# Patient Record
Sex: Female | Born: 1945 | Race: Black or African American | Hispanic: No
Health system: Southern US, Community
[De-identification: ages and names within clinical notes are randomized; demographics above are authoritative.]

---

## 2017-11-21 ENCOUNTER — Emergency Department (HOSPITAL_COMMUNITY): Payer: Self-pay

## 2017-11-21 ENCOUNTER — Encounter (HOSPITAL_COMMUNITY): Payer: Self-pay

## 2017-11-21 ENCOUNTER — Other Ambulatory Visit: Payer: Self-pay

## 2017-11-21 ENCOUNTER — Emergency Department (HOSPITAL_COMMUNITY)
Admission: EM | Admit: 2017-11-21 | Discharge: 2017-11-21 | Disposition: A | Discharge status: Home / Self Care | Payer: Self-pay | Attending: Emergency Medicine | Admitting: Emergency Medicine

## 2017-11-21 DIAGNOSIS — E042 Nontoxic multinodular goiter: Secondary | ICD-10-CM | POA: Insufficient documentation

## 2017-11-21 DIAGNOSIS — R0789 Other chest pain: Secondary | ICD-10-CM | POA: Insufficient documentation

## 2017-11-21 DIAGNOSIS — E010 Iodine-deficiency related diffuse (endemic) goiter: Secondary | ICD-10-CM | POA: Insufficient documentation

## 2017-11-21 LAB — COMPREHENSIVE METABOLIC PANEL
ALT: 23 U/L (ref 14–54)
AST: 29 U/L (ref 15–41)
Albumin: 4.3 g/dL (ref 3.5–5.0)
Alkaline Phosphatase: 65 U/L (ref 38–126)
Anion gap: 11 (ref 5–15)
BUN: 13 mg/dL (ref 6–20)
CO2: 28 mmol/L (ref 22–32)
Calcium: 9.6 mg/dL (ref 8.9–10.3)
Chloride: 90 mmol/L — ABNORMAL LOW (ref 101–111)
Creatinine, Ser: 0.63 mg/dL (ref 0.44–1.00)
GFR calc Af Amer: >60 mL/min (ref >60)
GFR calc non Af Amer: >60 mL/min (ref >60)
Glucose, Bld: 132 mg/dL — ABNORMAL HIGH (ref 65–99)
Potassium: 3.5 mmol/L (ref 3.5–5.1)
Sodium: 129 mmol/L — ABNORMAL LOW (ref 135–145)
Total Bilirubin: 0.6 mg/dL (ref 0.3–1.2)
Total Protein: 9.5 g/dL — ABNORMAL HIGH (ref 6.5–8.1)

## 2017-11-21 LAB — CBC
HCT: 40.6 % (ref 36.0–46.0)
Hemoglobin: 14.8 g/dL (ref 12.0–15.0)
MCH: 30.1 pg (ref 26.0–34.0)
MCHC: 36.5 g/dL — ABNORMAL HIGH (ref 30.0–36.0)
MCV: 82.5 fL (ref 78.0–100.0)
Platelets: 255 10*3/uL (ref 150–400)
RBC: 4.92 MIL/uL (ref 3.87–5.11)
RDW: 13.3 % (ref 11.5–15.5)
WBC: 5.1 10*3/uL (ref 4.0–10.5)

## 2017-11-21 LAB — TSH: TSH: 1.42 u[IU]/mL (ref 0.350–4.500)

## 2017-11-21 LAB — I-STAT TROPONIN, ED: Troponin i, poc: 0.02 ng/mL (ref 0.00–0.08)

## 2017-11-21 MED ORDER — ONDANSETRON 4 MG PO TBDP
4.0000 mg | ORAL_TABLET | Freq: Once | ORAL | Status: AC
  Administered 2017-11-21: 4 mg via ORAL
  Filled 2017-11-21: qty 1

## 2017-11-21 MED ORDER — ONDANSETRON HCL 4 MG PO TABS: 4.0000 mg | ORAL_TABLET | Freq: Three times a day (TID) | ORAL | 0 refills | Status: AC | PRN

## 2017-11-21 MED ORDER — IOHEXOL 300 MG/ML  SOLN
75.0000 mL | Freq: Once | INTRAMUSCULAR | Status: AC | PRN
  Administered 2017-11-21: 75 mL via INTRAVENOUS

## 2017-11-21 MED ORDER — SODIUM CHLORIDE 0.9 % IV BOLUS
1000.0000 mL | Freq: Once | INTRAVENOUS | Status: AC
  Administered 2017-11-21: 1000 mL via INTRAVENOUS

## 2017-11-21 NOTE — ED Triage Notes (Signed)
Pt reports a lump to the L side of her neck x1 year. She is from [place] and arrived here about 1 month ago. Her family member became concerned about it and got her to come in. She denies pain. She states that when she lays down, it makes it more difficult to breath and she sometimes has night sweats. She denies cough. A&Ox4.

## 2017-11-21 NOTE — ED Provider Notes (Signed)
[facility]-EMERGENCY DEPT Provider Note   CSN: [id number] Arrival date & time: 11/21/17  1638     History   Chief Complaint Chief Complaint  Patient presents with  . Neck Pain    HPI Robin Moyer is a 72 y.o. female with history of hypertension presents for evaluation of acute onset, progressively worsening swelling to the left side of the neck.  Patient's son served as Nurse, learning disability through the duration of the encounter.  Patient noted swelling to the left side of the neck over the past 1 year which has been progressively enlarging.  She states that sometimes she feels as though the mass will move to the anterior aspect of the neck and will cause her to feel difficulty breathing, swallowing, and sometimes a choking sensation.  No drooling or facial swelling.  The mass is not particularly painful. She does note over the past few days she feels that  The mass has worsened also notes subjective fevers and chills and night sweats.  Son has noticed that she has lost some weight over the last year.  She does note intermittent chest pain for the past few days which she describes as a burning sensation.  No aggravating or alleviating factors noted.  Denies abdominal pain, vomiting, diarrhea, or constipation but she does have chronic nausea.  Has not tried anything for her symptoms. Of note, her hypertension was recently diagnosed 1 month ago while she was being evaluated for knee pain.  The history is provided by the patient.    History reviewed. No pertinent past medical history.  There are no active problems to display for this patient.    OB History   None      Home Medications    Prior to Admission medications   Medication Sig Start Date End Date Taking? Authorizing Provider  amLODipine (NORVASC) 5 MG tablet Take 5 mg by mouth daily. 11/15/17  Yes [provider]  amoxicillin (AMOXIL) 500 MG capsule Take 500 mg by mouth 3 (three) times daily. For 10  days for dental infection 11/15/17  Yes [provider]  cloNIDine (CATAPRES) 0.1 MG tablet Take 0.1 mg by mouth See admin instructions. Take 0.1 mg by mouth daily for 2 weeks then increase to 0.1 mg twice daily 11/15/17  Yes [provider]  Multiple Vitamins-Minerals (MULTIVITAMIN WOMEN 50+) TABS Take 1 tablet by mouth daily.   Yes [provider]  triamterene-hydrochlorothiazide (MAXZIDE-25) 37.5-25 MG tablet Take 0.5 tablets by mouth daily. 11/15/17  Yes [provider]  ondansetron (ZOFRAN) 4 MG tablet Take 1 tablet (4 mg total) by mouth every 8 (eight) hours as needed for nausea or vomiting. 11/21/17   Jeanie Sewer, PA-C    Family History History reviewed. No pertinent family history.  Social History Social History   Tobacco Use  . Smoking status: Not on file  Substance Use Topics  . Alcohol use: Not on file  . Drug use: Not on file     Allergies   Patient has no known allergies.   Review of Systems Review of Systems  Constitutional: Positive for chills, fever and unexpected weight change.  HENT: Positive for trouble swallowing. Negative for drooling and facial swelling.   Respiratory: Positive for shortness of breath.   Cardiovascular: Positive for chest pain.  Gastrointestinal: Positive for nausea. Negative for abdominal pain and vomiting.  All other systems reviewed and are negative.    Physical Exam Updated Vital Signs BP (!) 156/79   Pulse  93   Temp 98.2 F (36.8 C) (Oral)   Resp 19   SpO2 98%   Physical Exam  Constitutional: She appears well-developed and well-nourished. No distress.  HENT:  Head: Normocephalic and atraumatic.  Eyes: Conjunctivae are normal. Right eye exhibits no discharge. Left eye exhibits no discharge.  Neck: Normal range of motion. Neck supple. No JVD present. No tracheal deviation present.  6 x 4 cm area of fullness to the left lateral aspect of the neck.  Nontender, no overlying erythema or warmth.   Not particularly fluctuant or mobile.  Cardiovascular:  Tachycardic, 2+ radial and DP/PT pulses bl, Homan's sign absent bilaterally, no significant lower extremity edema, no carotid bruit  Pulmonary/Chest: Effort normal.  Globally diminished breath sounds, equal rise and fall of chest.  Diffuse tenderness to palpation of the anterior chest wall with no deformity, crepitus, ecchymosis, or flail segment noted.  Abdominal: Soft. Bowel sounds are normal. She exhibits no distension. There is no tenderness. There is no guarding.  Musculoskeletal: She exhibits no edema.  Lymphadenopathy:    She has no cervical adenopathy.  Neurological: She is alert.  Skin: Skin is warm and dry. No erythema.  Psychiatric: She has a normal mood and affect. Her behavior is normal.  Nursing note and vitals reviewed.    ED Treatments / Results  Labs (all labs ordered are listed, but only abnormal results are displayed) Labs Reviewed  CBC - Abnormal; Notable for the following components:      Result Value   MCHC 36.5 (*)    All other components within normal limits  COMPREHENSIVE METABOLIC PANEL - Abnormal; Notable for the following components:   Sodium 129 (*)    Chloride 90 (*)    Glucose, Bld 132 (*)    Total Protein 9.5 (*)    All other components within normal limits  TSH  I-STAT TROPONIN, ED    EKG EKG Interpretation  Date/Time:  Thursday November 21 2017 18:12:21 EDT Ventricular Rate:  101 PR Interval:    QRS Duration: 82 QT Interval:  379 QTC Calculation: 492 R Axis:   35 Text Interpretation:  Sinus tachycardia Consider right atrial enlargement Abnormal R-wave progression, early transition Probable LVH with secondary repol abnrm Anterior Q waves, possibly due to LVH No old tracing to compare Confirmed by Eber HongMiller, Brian ([id number]) on 11/21/2017 6:52:07 PM   Radiology Dg Chest 2 View  Result Date: 11/21/2017 CLINICAL DATA:  LEFT neck mass for 1 year. EXAM: CHEST - 2 VIEW COMPARISON:  None.  FINDINGS: Cardiomediastinal silhouette is normal. Calcified aortic arch. No pleural effusions or focal consolidations. Trachea projects midline and there is no pneumothorax. Soft tissue planes and included osseous structures are non-suspicious. Moderate degenerative change of the thoracic spine. Asymmetric fullness LEFT neck soft tissues. IMPRESSION: No acute cardiopulmonary process. Asymmetric fullness LEFT neck soft tissues. Aortic Atherosclerosis (ICD10-I70.0). Electronically Signed   By: Awilda Metroourtnay  Bloomer M.D.   On: 11/21/2017 19:31   Ct Soft Tissue Neck W Contrast  Result Date: 11/21/2017 CLINICAL DATA:  LEFT neck mass for 1 year. EXAM: CT NECK WITH CONTRAST TECHNIQUE: Multidetector CT imaging of the neck was performed using the standard protocol following the bolus administration of intravenous contrast. CONTRAST:  75mL OMNIPAQUE IOHEXOL 300 MG/ML  SOLN COMPARISON:  None. FINDINGS: PHARYNX AND LARYNX: Normal.  Widely patent airway. SALIVARY GLANDS: Normal. THYROID: Thyromegaly with LEFT greater than RIGHT lobe enlargement. Dominant 2.9 cm RIGHT thyroid nodule with marginal calcifications. LYMPH NODES: No lymphadenopathy by CT size  criteria. VASCULAR: Patent. Carotid arteries are posterolaterally displaced by enlarged thyroid. Mild calcific atherosclerosis RIGHT carotid bifurcation. LIMITED INTRACRANIAL: Normal. VISUALIZED ORBITS: Normal. MASTOIDS AND VISUALIZED PARANASAL SINUSES: Bilateral middle ear and mastoid effusions. Included paranasal sinuses are well aerated. SKELETON: Nonacute. Poor dentition with multiple dental caries and periapical abscess. UPPER CHEST: Lung apices are clear. No superior mediastinal lymphadenopathy. OTHER: None. IMPRESSION: 1. No acute process in the neck. 2. Thyromegaly and multinodular goiter, likely accounting for LEFT neck mass. Dominant 2.9 cm RIGHT thyroid nodule. Recommend thyroid sonogram on non emergent basis. This follows ACR consensus guidelines: Managing Incidental  Thyroid Nodules Detected on Imaging: White Paper of the ACR Incidental Thyroid Findings Committee. J Am Coll Radiol 2015; 12:143-150. 3. Bilateral middle ear and mastoid effusions. 4. Poor dentition. Electronically Signed   By: Awilda Metro M.D.   On: 11/21/2017 20:07    Procedures Procedures (including critical care time)  Medications Ordered in ED Medications  sodium chloride 0.9 % bolus 1,000 mL (1,000 mLs Intravenous New Bag/Given 11/21/17 2003)  ondansetron (ZOFRAN-ODT) disintegrating tablet 4 mg (4 mg Oral Given 11/21/17 1833)  iohexol (OMNIPAQUE) 300 MG/ML solution 75 mL (75 mLs Intravenous Contrast Given 11/21/17 1939)     Initial Impression / Assessment and Plan / ED Course  I have reviewed the triage vital signs and the nursing notes.  Pertinent labs & imaging results that were available during my care of the patient were reviewed by me and considered in my medical decision making (see chart for details).     Patient presents with progressively worsening left-sided fullness of the neck for 1 year.  Afebrile, initially tachycardic and somewhat hypertensive with improvement on reevaluation.  Swelling has been causing some difficulty breathing and swallowing.  She also describes atypical chest pain.  Chest x-ray shows no acute cardiopulmonary abnormalities but does show asymmetric fullness of the left neck soft tissues.  EKG shows sinus tachycardia with probable left ventricular hypertrophy, no ischemic changes.  Troponin is negative and I doubt ACS or MI.  Doubt PE.  She denies any significant shortness of breath at this time, has no increased work of breathing or hypoxia.  Lab work reviewed by me shows no anemia, no leukocytosis.  Mild hyponatremia and hypochloremia anion gap within normal limits.  TSH is within normal limits.  CT soft tissue of the neck shows thyromegaly and a multinodular goiter, no lymphadenopathy.  She is stable for discharge home with follow-up with her PCP and  should obtain a thyroid sonogram on an outpatient basis.  Patient is resting comfortably no apparent distress.  She has chronic nausea but this resolved with Zofran.  Will discharge with a small amount to use as needed.  Strict ED return precautions discussed.  Patient and patient's son verbalized understanding of and agreement with plan and patient stable for discharge home at this time.  No complaints prior to discharge and opportunity to ask questions was given.  Final Clinical Impressions(s) / ED Diagnoses   Final diagnoses:  Thyromegaly  Multinodular goiter  Atypical chest pain    ED Discharge Orders        Ordered    ondansetron (ZOFRAN) 4 MG tablet  Every 8 hours PRN     11/21/17 2032       Bennye Alm 11/21/17 2041    Eber Hong, MD 11/23/17 1758

## 2017-11-21 NOTE — Discharge Instructions (Signed)
Take Zofran as needed for nausea.  Wait around 20 to 30 minutes before having anything to eat or drink to give this medication time to work.  You may also use over-the-counter medicine such as Tums as needed for indigestion.  Drink plenty of water and get plenty of rest.  Continue to take your blood pressure medications as prescribed.  Your work-up today showed that you have a multinodular goiter and enlargement of your thyroid.  This will require an ultrasound for further work-up.  Follow-up with your primary care physician for reevaluation of your symptoms and to obtain this imaging.   Return to the emergency department immediately for any concerning signs or symptoms develop such as worsening swelling, persistent difficulty breathing or swallowing, high fevers, or chest pain.

## 2017-11-21 NOTE — ED Provider Notes (Signed)
Medical screening examination/treatment/procedure(s) were conducted as a shared visit with non-physician practitioner(s) and myself.  I personally evaluated the patient during the encounter.  Clinical Impression:   Final diagnoses:  Thyromegaly  Multinodular goiter  Atypical chest pain   The patient is a pleasant 72 year old female, history of hypertension diagnosed 1 month ago, formally untreated as she was living in [place] any medical care.  She reports approximately 1 year or more of a progressive swelling of the left side of her neck, she feels like it is mobile and sometimes comes down towards the sternal notch and sometimes has difficulty swallowing or breathing.  She is having neither of those symptoms at this time.  She is in the care of her son who is the primary translator for her.  There has been reportedly some shortness of breath, some coughing, some night sweats or fevers, it is unclear when all the symptoms started.  On exam the patient has clear heart and lung sounds without murmurs, there is no carotid bruit however there is a fullness to the left side of the neck.  There is no lymphadenopathy, there is no redness or warmth to the overlying skin, the thyroid appears normal however the area of swelling is near the left lateral thyroid.  The patient will need formal CT imaging to rule out an underlying mass or tumor, she does not appear to be cachectic or in any distress and has no airway issues at this time.  She has been compliant with her medications.   Eber HongMiller, Farrell Pantaleo, MD 11/23/17 [phone]

## 2017-11-21 NOTE — ED Notes (Signed)
Patient transported to CT 

## 2020-01-07 IMAGING — CT CT NECK W/ CM
3 of 5 series · 12 of 33 positions shown, 14 images · IV contrast (omnipaque)
Comparison: None.

CLINICAL DATA: LEFT neck mass for 1 year.

EXAM:
CT NECK WITH CONTRAST
TECHNIQUE: Multidetector CT imaging of the neck was performed using the
standard protocol following the bolus administration of intravenous
contrast.
CONTRAST:  75mL OMNIPAQUE IOHEXOL 300 MG/ML  SOLN

[Series 4: orthogonal ax · axial · 0.33mm/px · z∈[-280,-110]mm · 4 of 137 slices shown, 5 images]
[im 23/137  soft-tissue]
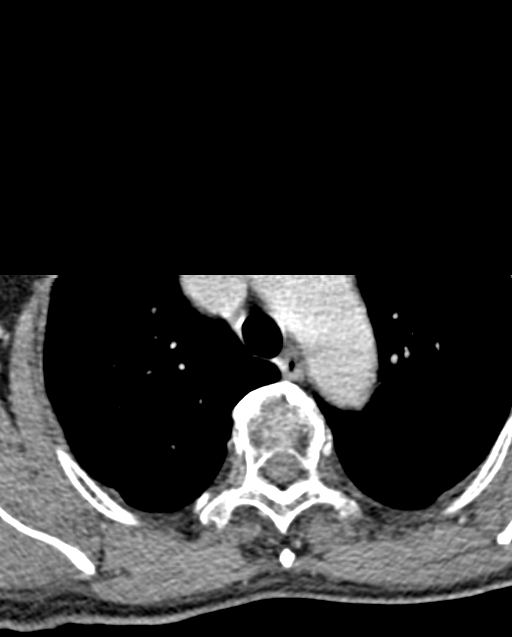
[im 23/137  bone]
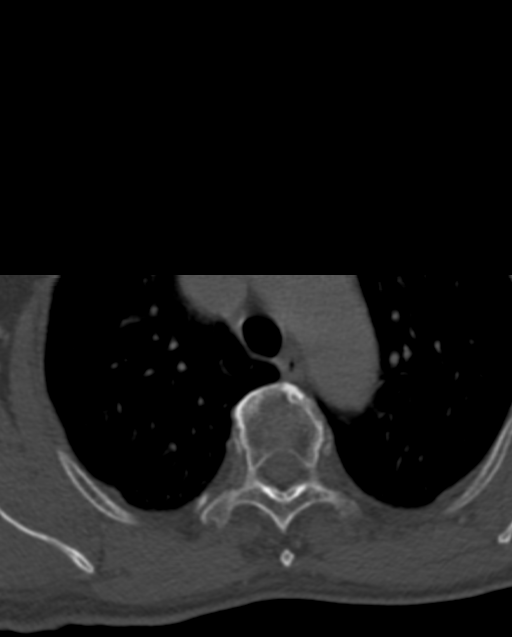
[im 46/137  bone]
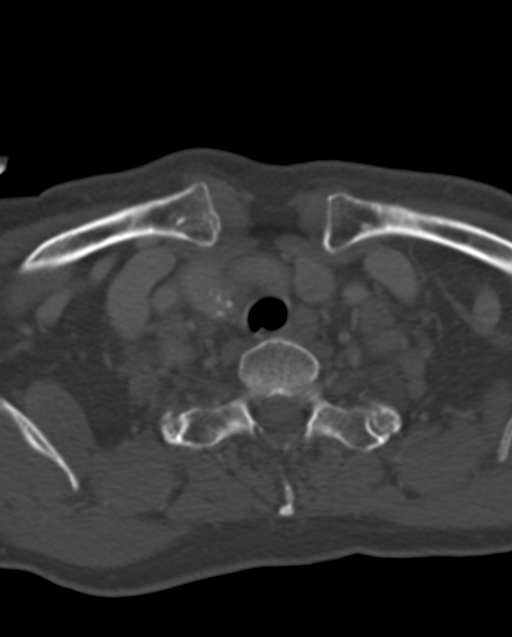
[im 91/137  bone]
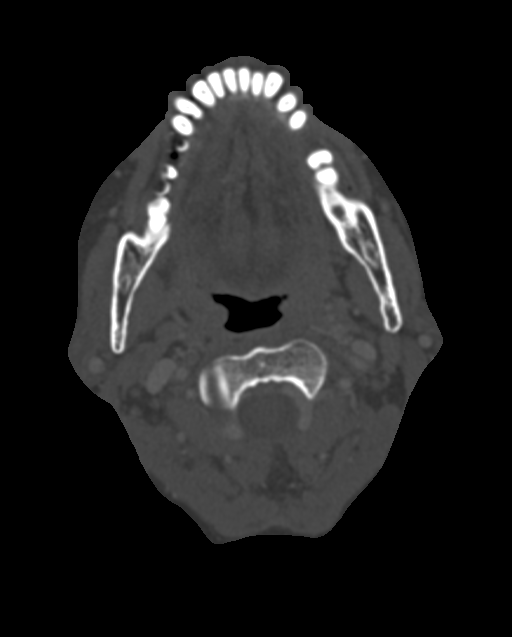
[im 114/137  bone]
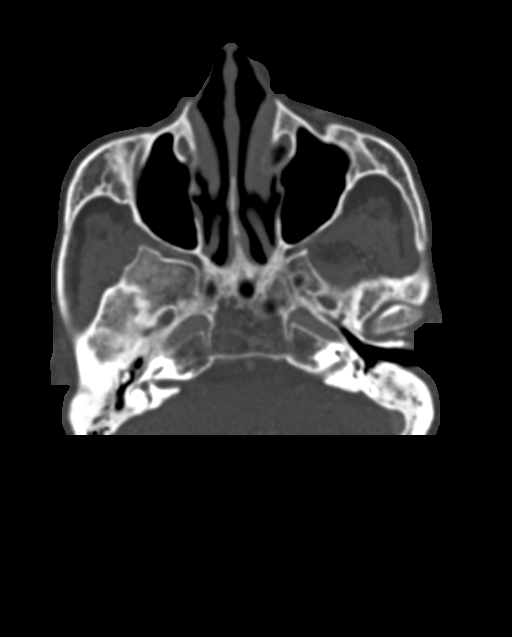

[Series 5: cor neck · coronal · 0.33mm/px · 3 of 102 slices shown]
[im 30/102  bone]
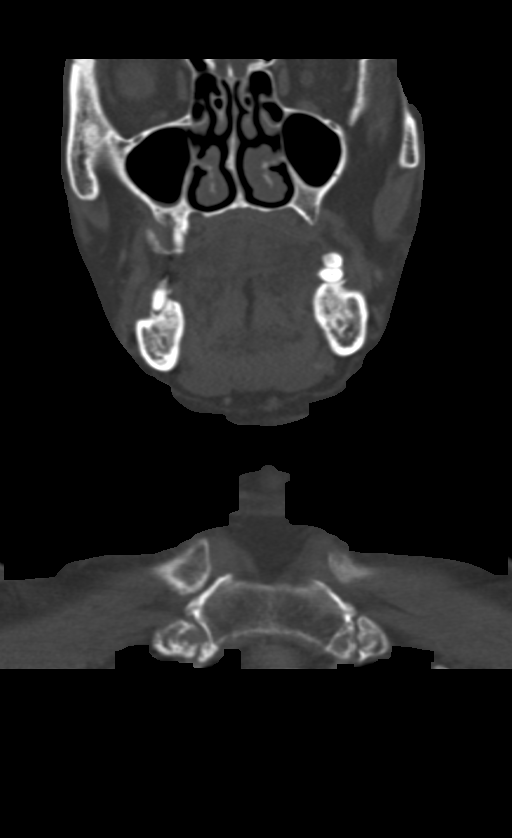
[im 44/102  bone]
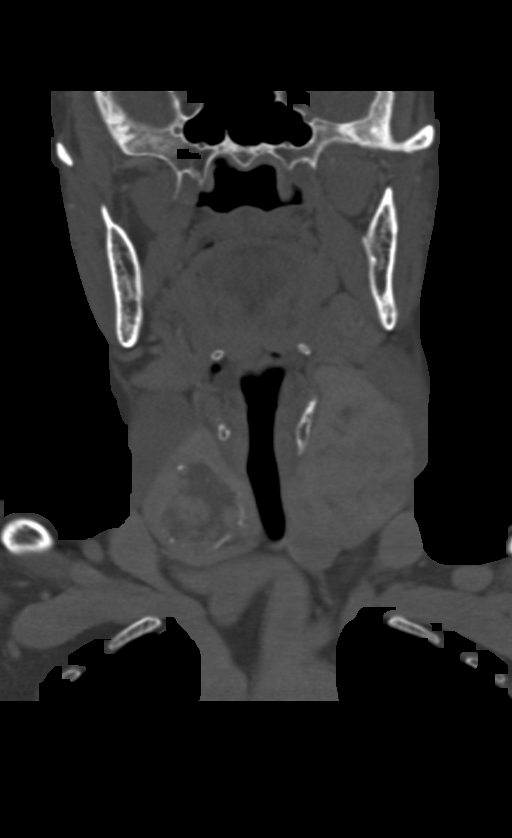
[im 58/102  bone]
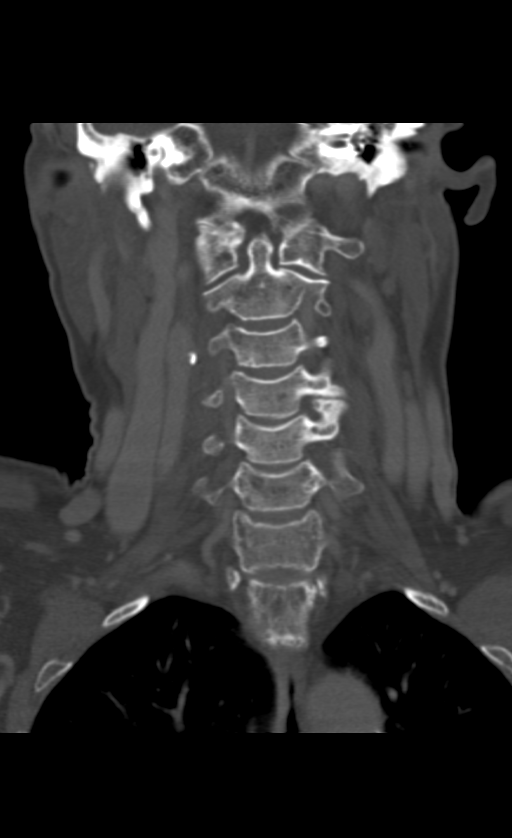

[Series 6: sag neck · sagittal · 0.41mm/px · 5 of 85 slices shown, 6 images]
[im 29/85  bone]
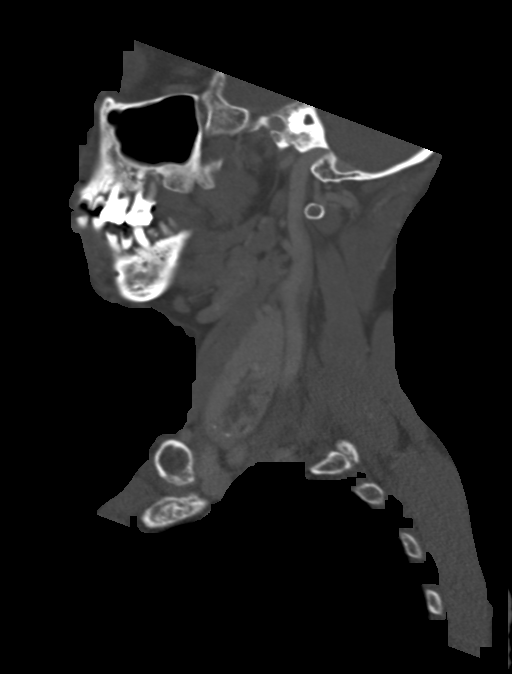
[im 36/85  bone]
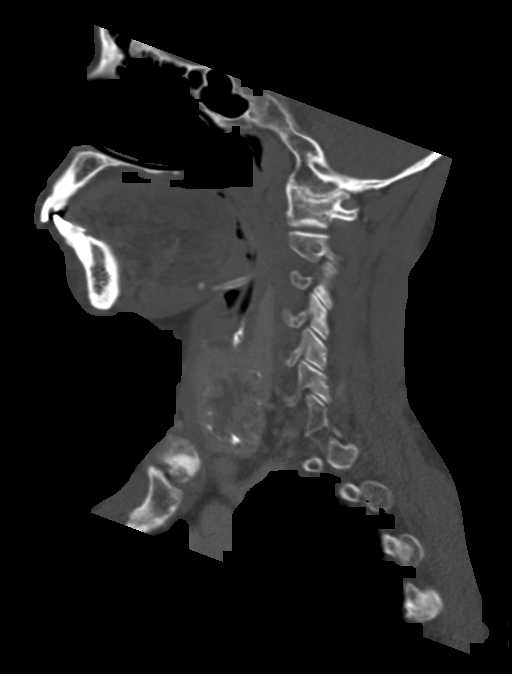
[im 43/85  soft-tissue]
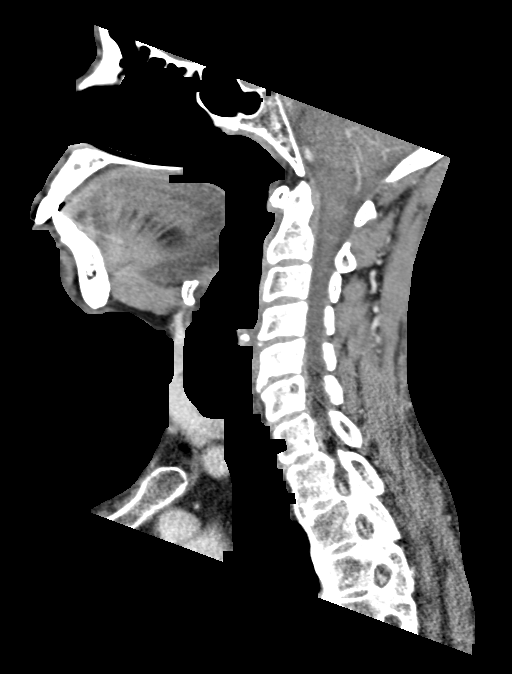
[im 43/85  bone]
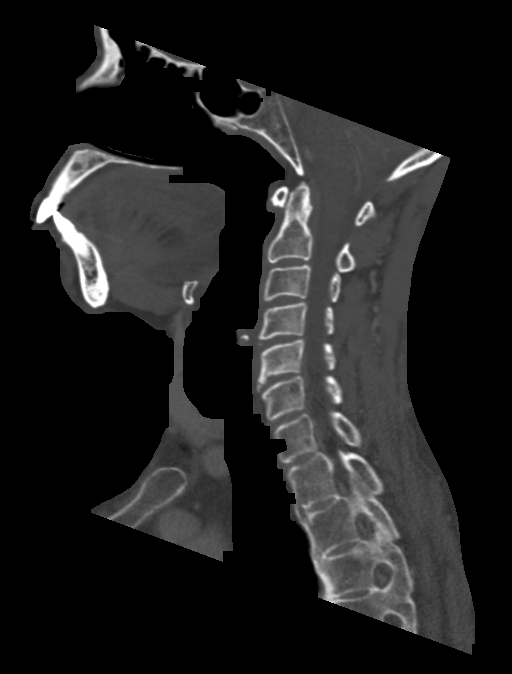
[im 50/85  bone]
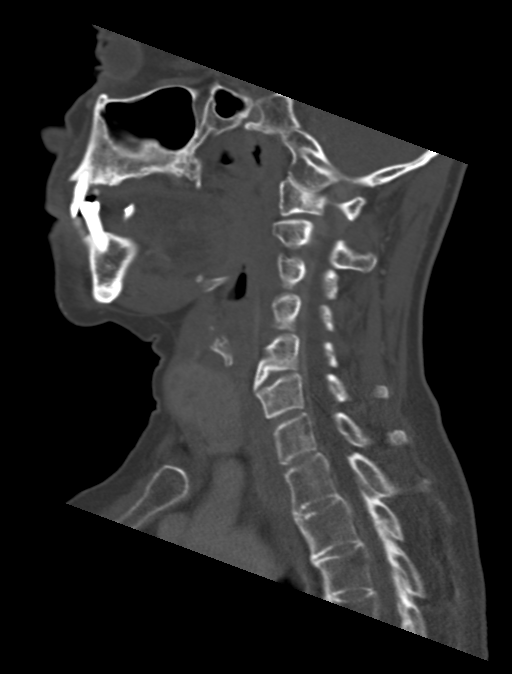
[im 57/85  bone]
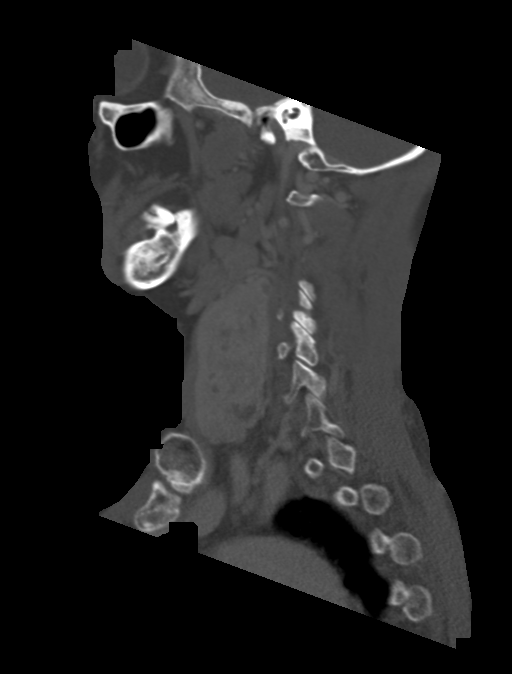

[12 of 33 positions shown; findings below may reference images not displayed]

FINDINGS: PHARYNX AND LARYNX: Normal.  Widely patent airway.

SALIVARY GLANDS: Normal.

THYROID: Thyromegaly with LEFT greater than RIGHT lobe enlargement.
Dominant 2.9 cm RIGHT thyroid nodule with marginal calcifications.

LYMPH NODES: No lymphadenopathy by CT size criteria.

VASCULAR: Patent. Carotid arteries are posterolaterally displaced by
enlarged thyroid. Mild calcific atherosclerosis RIGHT carotid
bifurcation.

LIMITED INTRACRANIAL: Normal.

VISUALIZED ORBITS: Normal.

MASTOIDS AND VISUALIZED PARANASAL SINUSES: Bilateral middle ear and
mastoid effusions. Included paranasal sinuses are well aerated.

SKELETON: Nonacute. Poor dentition with multiple dental caries and
periapical abscess.

UPPER CHEST: Lung apices are clear. No superior mediastinal
lymphadenopathy.

OTHER: None.
IMPRESSION: 1. No acute process in the neck.
2. Thyromegaly and multinodular goiter, likely accounting for LEFT
neck mass. Dominant 2.9 cm RIGHT thyroid nodule. Recommend thyroid
sonogram on non emergent basis. This follows ACR consensus
guidelines: Managing Incidental Thyroid Nodules Detected on Imaging:
White Paper of [REDACTED]. [HOSPITAL] [id number]; [DATE].
3. Bilateral middle ear and mastoid effusions.
4. Poor dentition.
